# Patient Record
Sex: Male | Born: 1960 | Race: White | Hispanic: No | Marital: Married
Health system: Southern US, Community
[De-identification: ages and names within clinical notes are randomized; demographics above are authoritative.]

## PROBLEM LIST (undated history)

## (undated) DIAGNOSIS — L570 Actinic keratosis: Secondary | ICD-10-CM

## (undated) DIAGNOSIS — C4491 Basal cell carcinoma of skin, unspecified: Secondary | ICD-10-CM

## (undated) HISTORY — DX: Basal cell carcinoma of skin, unspecified: C44.91

## (undated) HISTORY — DX: Actinic keratosis: L57.0

---

## 2000-08-13 ENCOUNTER — Encounter: Payer: Self-pay | Admitting: Family Medicine

## 2000-08-13 ENCOUNTER — Encounter: Admission: RE | Admit: 2000-08-13 | Discharge: 2000-08-13 | Payer: Self-pay | Admitting: Family Medicine

## 2004-03-03 ENCOUNTER — Emergency Department: Payer: Self-pay | Admitting: Emergency Medicine

## 2008-01-10 ENCOUNTER — Ambulatory Visit: Payer: Self-pay | Admitting: Family Medicine

## 2009-05-04 ENCOUNTER — Ambulatory Visit: Payer: Self-pay | Admitting: Family Medicine

## 2009-05-15 ENCOUNTER — Ambulatory Visit: Payer: Self-pay | Admitting: Internal Medicine

## 2012-02-07 ENCOUNTER — Ambulatory Visit: Payer: Self-pay | Admitting: Internal Medicine

## 2012-02-07 LAB — URINALYSIS, COMPLETE
Bacteria: NEGATIVE
Bilirubin,UR: NEGATIVE
Blood: NEGATIVE
Glucose,UR: NEGATIVE mg/dL (ref 0–75)
Ketone: NEGATIVE
Leukocyte Esterase: NEGATIVE
Nitrite: NEGATIVE
Ph: 6 (ref 4.5–8.0)
Protein: NEGATIVE
RBC,UR: NONE SEEN /HPF (ref 0–5)
Specific Gravity: 1.02 (ref 1.003–1.030)
Squamous Epithelial: NONE SEEN

## 2012-02-07 LAB — RAPID INFLUENZA A&B ANTIGENS

## 2012-02-08 ENCOUNTER — Emergency Department: Payer: Self-pay | Admitting: Internal Medicine

## 2012-02-08 LAB — CBC
HCT: 46.6 % (ref 40.0–52.0)
HGB: 16.1 g/dL (ref 13.0–18.0)
MCH: 31.1 pg (ref 26.0–34.0)
MCHC: 34.5 g/dL (ref 32.0–36.0)
MCV: 90 fL (ref 80–100)
Platelet: 235 10*3/uL (ref 150–440)
RBC: 5.17 10*6/uL (ref 4.40–5.90)
RDW: 12.6 % (ref 11.5–14.5)
WBC: 9.9 10*3/uL (ref 3.8–10.6)

## 2012-02-08 LAB — DIFFERENTIAL
Basophil #: 0.1 10*3/uL (ref 0.0–0.1)
Basophil %: 0.7 %
Eosinophil #: 0 10*3/uL (ref 0.0–0.7)
Eosinophil %: 0.3 %
Lymphocyte #: 1.3 10*3/uL (ref 1.0–3.6)
Lymphocyte %: 13.6 %
Monocyte #: 0.6 x10 3/mm (ref 0.2–1.0)
Monocyte %: 6.4 %
Neutrophil #: 7.9 10*3/uL — ABNORMAL HIGH (ref 1.4–6.5)
Neutrophil %: 79 %

## 2012-02-08 LAB — COMPREHENSIVE METABOLIC PANEL
Albumin: 4.1 g/dL (ref 3.4–5.0)
Alkaline Phosphatase: 91 U/L (ref 50–136)
Anion Gap: 9 (ref 7–16)
BUN: 12 mg/dL (ref 7–18)
Bilirubin,Total: 1.1 mg/dL — ABNORMAL HIGH (ref 0.2–1.0)
Calcium, Total: 8.6 mg/dL (ref 8.5–10.1)
Chloride: 103 mmol/L (ref 98–107)
Co2: 27 mmol/L (ref 21–32)
Creatinine: 1.44 mg/dL — ABNORMAL HIGH (ref 0.60–1.30)
EGFR (African American): >60
EGFR (Non-African Amer.): 56 — ABNORMAL LOW
Glucose: 117 mg/dL — ABNORMAL HIGH (ref 65–99)
Osmolality: 278 (ref 275–301)
Potassium: 3.9 mmol/L (ref 3.5–5.1)
SGOT(AST): 27 U/L (ref 15–37)
SGPT (ALT): 22 U/L (ref 12–78)
Sodium: 139 mmol/L (ref 136–145)
Total Protein: 7.4 g/dL (ref 6.4–8.2)

## 2012-02-09 LAB — URINE CULTURE

## 2012-02-14 LAB — CULTURE, BLOOD (SINGLE)

## 2014-03-26 IMAGING — CR DG CHEST 2V
1 series · 2 of 2 positions shown · non-contrast
Comparison: none

REASON FOR EXAM: fever, cough
COMMENTS:

PROCEDURE:     DXR - DXR CHEST PA (OR AP) AND LATERAL  - February 08, 2012  [DATE]
RESULT:     Comparison: None.

[Series 1: w chest pa · 0.14mm/px · 2 of 2 slices shown]
[im 1/2]
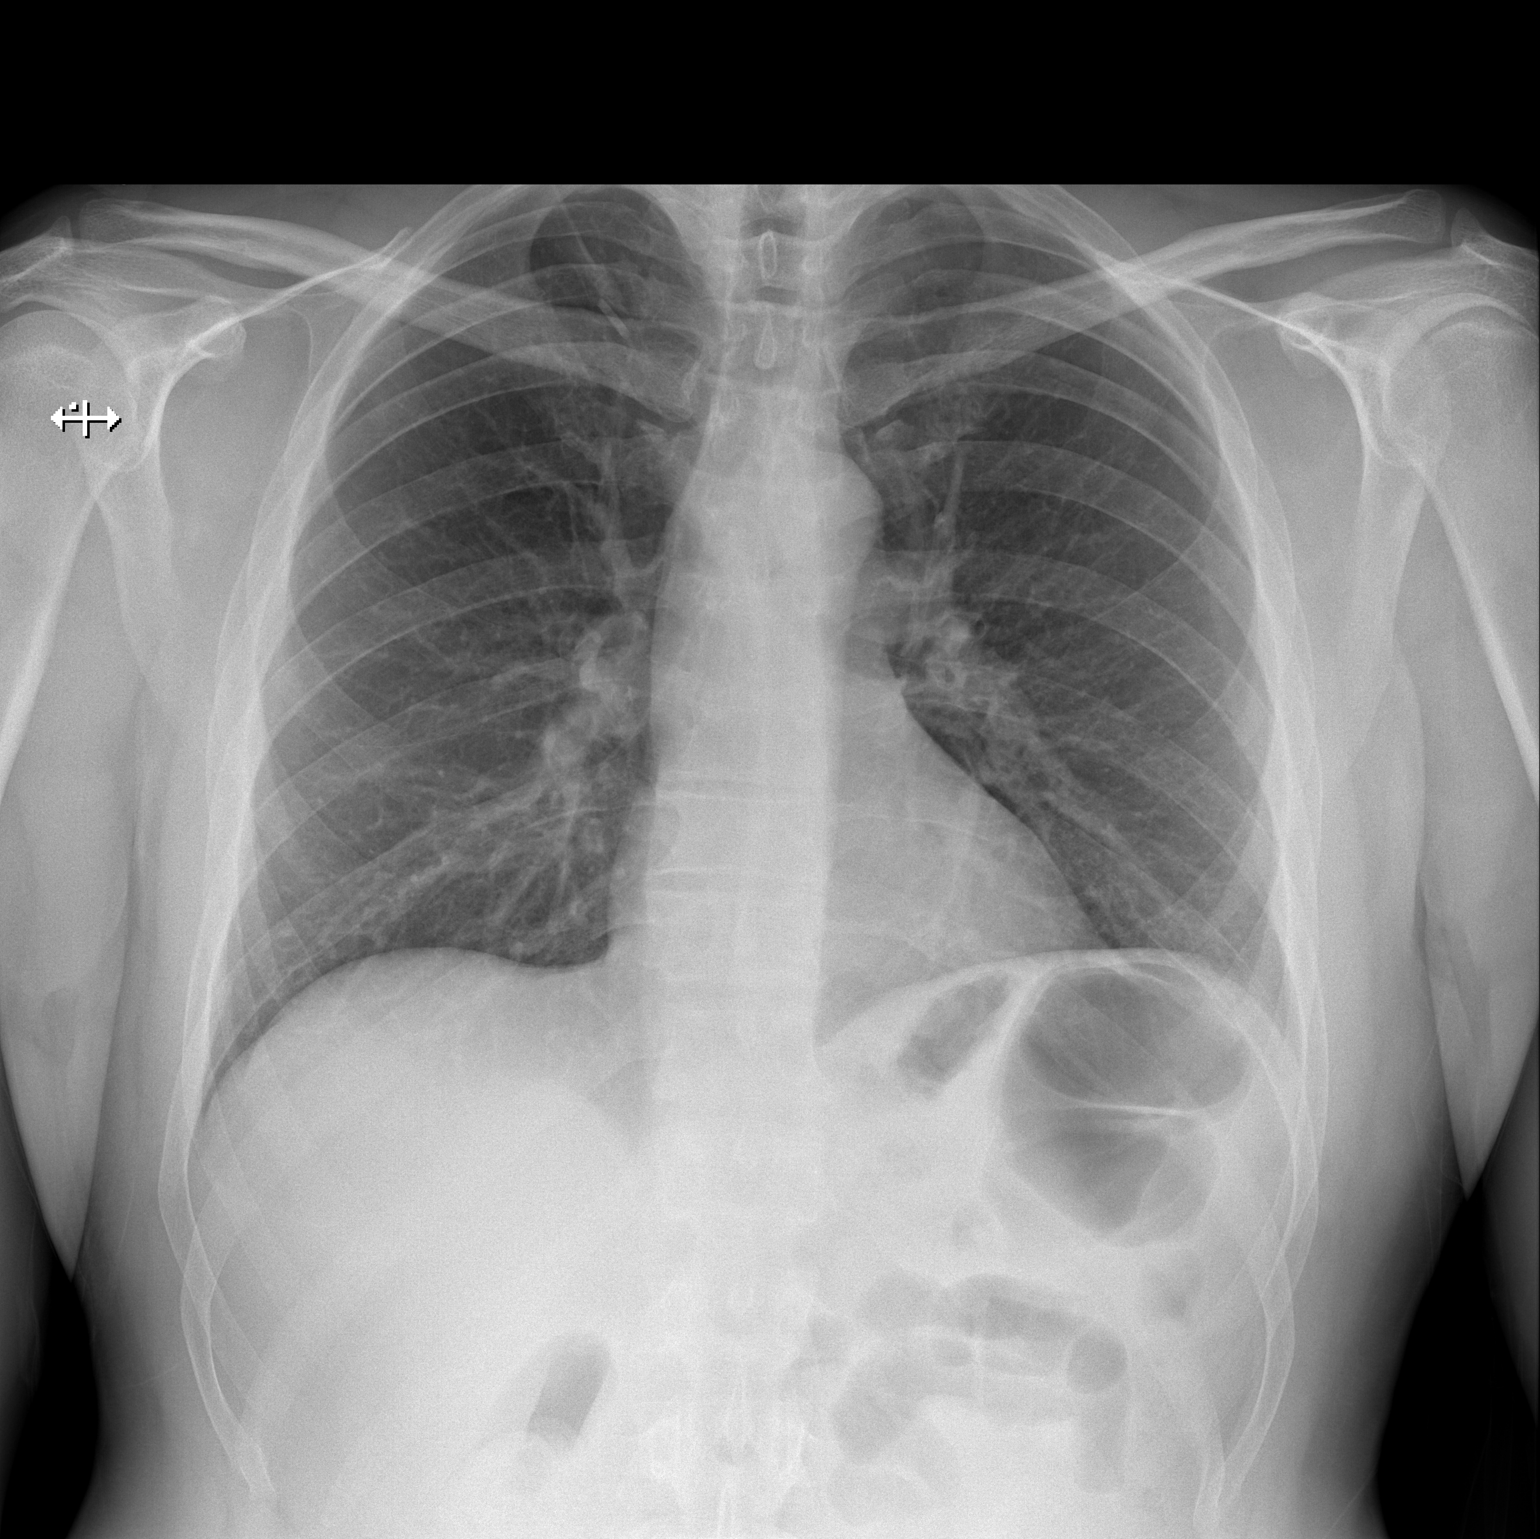
[im 2/2]
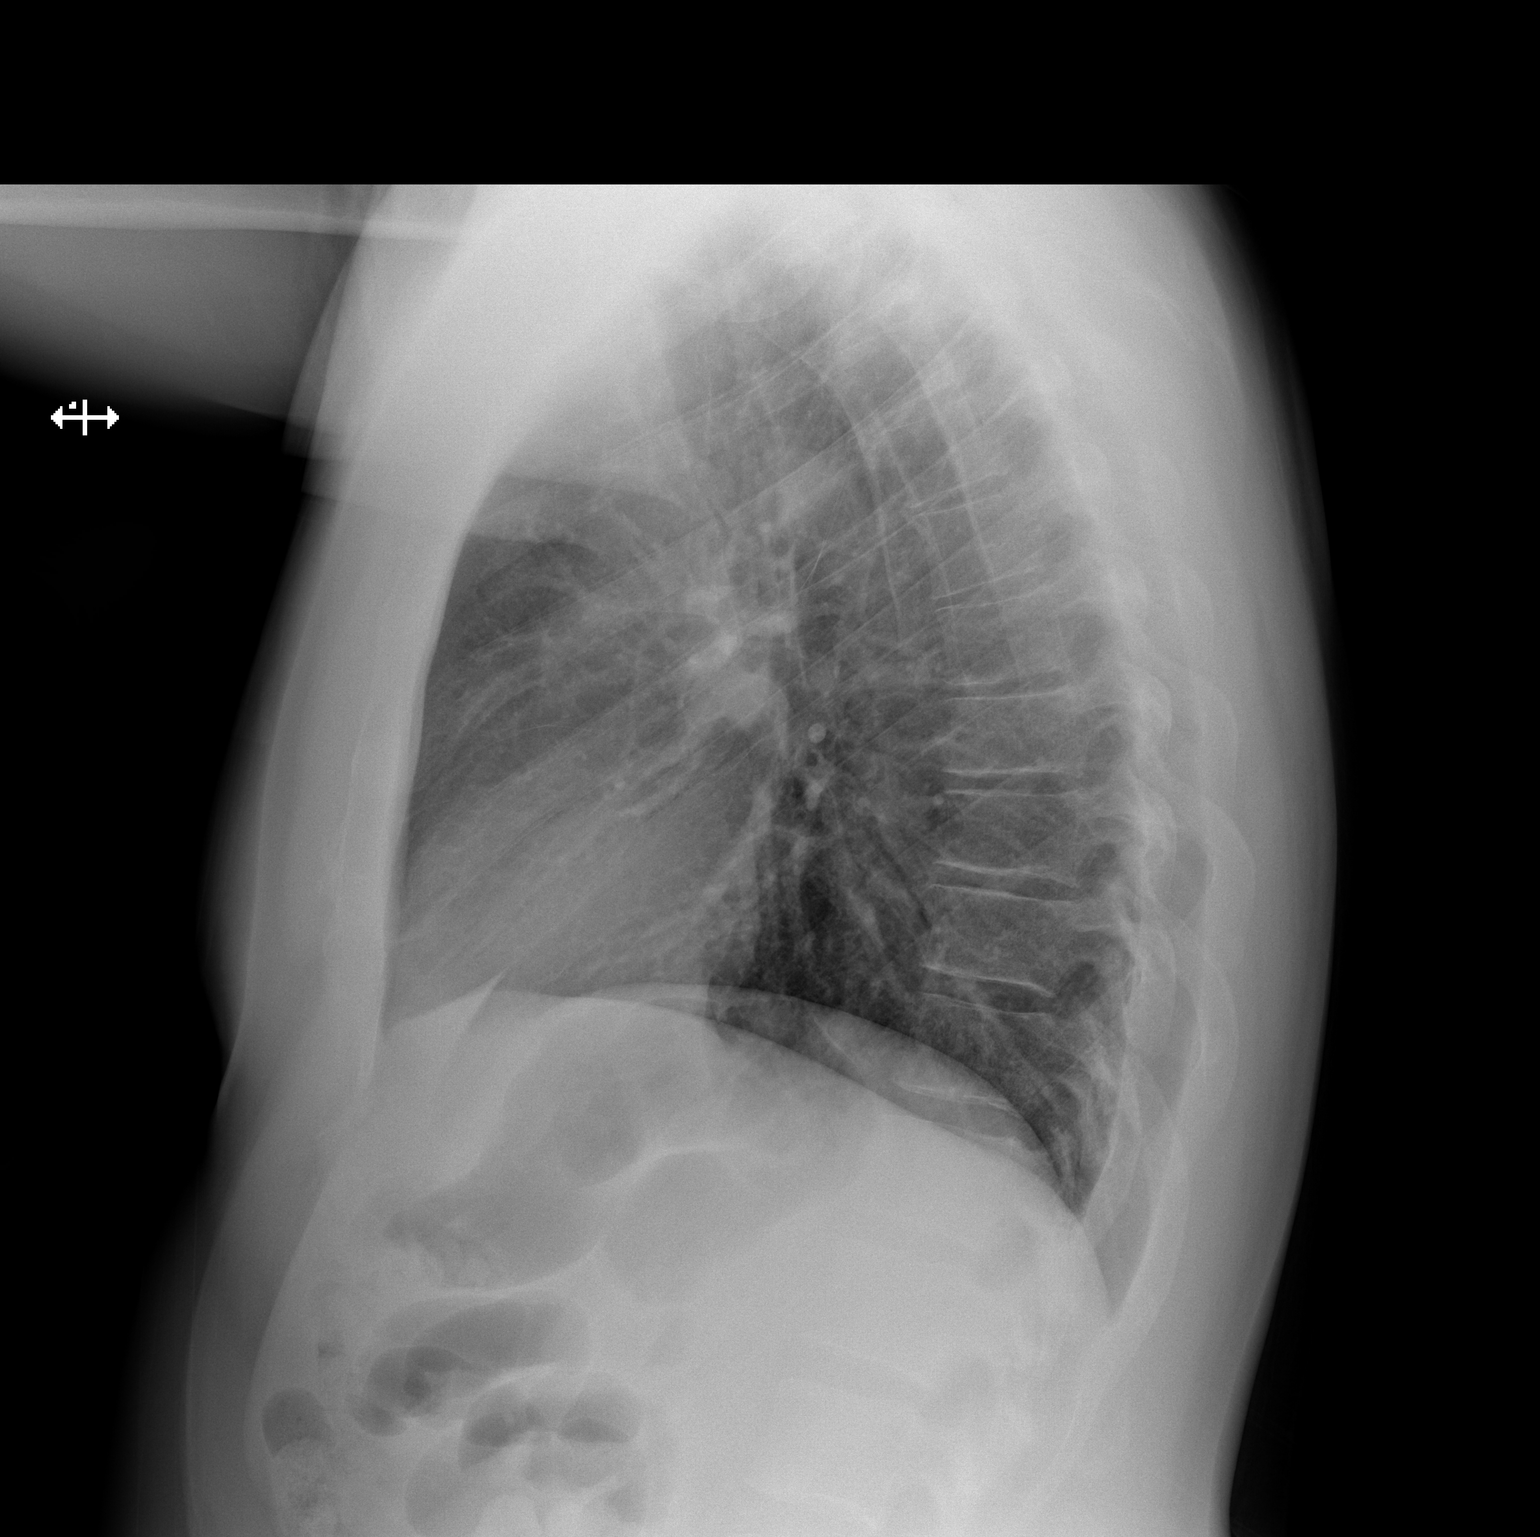

[2 of 2 positions shown; findings below may reference images not displayed]

FINDINGS: The heart and mediastinum are within normal limits. No focal pulmonary
opacities. Small linear density overlying the right upper lung is to be
external to the patient.
IMPRESSION: No acute cardiopulmonary disease.

[REDACTED]

## 2020-02-20 ENCOUNTER — Other Ambulatory Visit: Payer: Self-pay

## 2020-02-20 ENCOUNTER — Encounter: Payer: Self-pay | Admitting: Dermatology

## 2020-02-20 ENCOUNTER — Ambulatory Visit: Payer: Self-pay | Admitting: Dermatology

## 2020-02-20 DIAGNOSIS — B360 Pityriasis versicolor: Secondary | ICD-10-CM | POA: Diagnosis not present

## 2020-02-20 DIAGNOSIS — D1800 Hemangioma unspecified site: Secondary | ICD-10-CM

## 2020-02-20 DIAGNOSIS — Z85828 Personal history of other malignant neoplasm of skin: Secondary | ICD-10-CM

## 2020-02-20 DIAGNOSIS — L821 Other seborrheic keratosis: Secondary | ICD-10-CM

## 2020-02-20 DIAGNOSIS — D229 Melanocytic nevi, unspecified: Secondary | ICD-10-CM

## 2020-02-20 DIAGNOSIS — B353 Tinea pedis: Secondary | ICD-10-CM | POA: Diagnosis not present

## 2020-02-20 DIAGNOSIS — L814 Other melanin hyperpigmentation: Secondary | ICD-10-CM

## 2020-02-20 DIAGNOSIS — I781 Nevus, non-neoplastic: Secondary | ICD-10-CM | POA: Diagnosis not present

## 2020-02-20 DIAGNOSIS — Z1283 Encounter for screening for malignant neoplasm of skin: Secondary | ICD-10-CM

## 2020-02-20 DIAGNOSIS — L578 Other skin changes due to chronic exposure to nonionizing radiation: Secondary | ICD-10-CM

## 2020-02-20 MED ORDER — KETOCONAZOLE 2 % EX CREA: 1.0000 "application " | TOPICAL_CREAM | Freq: Every day | CUTANEOUS | 1 refills | Status: AC

## 2020-02-20 MED ORDER — KETOCONAZOLE 2 % EX SHAM: MEDICATED_SHAMPOO | CUTANEOUS | 11 refills | Status: DC

## 2020-02-20 MED ORDER — TERBINAFINE HCL 250 MG PO TABS: 250.0000 mg | ORAL_TABLET | Freq: Every day | ORAL | 0 refills | Status: AC

## 2020-02-20 NOTE — Patient Instructions (Signed)
Recommend daily broad spectrum sunscreen SPF 30+ to sun-exposed areas, reapply every 2 hours as needed. Call for new or changing lesions.  

## 2020-02-20 NOTE — Progress Notes (Signed)
   Follow-Up Visit   Subjective  Austin [name] is a 59 y.o. male who presents for the following: TBSE. The patient presents for Total-Body Skin Exam (TBSE) for skin cancer screening and mole check. Patient here for full body skin exam and skin cancer screening. Patient has an area of concern on his right foot. Patient states he has hx of skin cancer.  The following portions of the chart were reviewed this encounter and updated as appropriate:  Allergies  Meds  Problems  Med Hx  Surg Hx  Fam Hx     Review of Systems:  No other skin or systemic complaints except as noted in HPI or Assessment and Plan.  Objective  Well appearing patient in no apparent distress; mood and affect are within normal limits.  A full examination was performed including scalp, head, eyes, ears, nose, lips, neck, chest, axillae, abdomen, back, buttocks, bilateral upper extremities, bilateral lower extremities, hands, feet, fingers, toes, fingernails, and toenails. All findings within normal limits unless otherwise noted below.  Objective  Right Instep: Scaling and maceration web spaces and over distal and lateral soles.    Assessment & Plan  Telangiectasia B/L cheek Recommend laser treatment  Tinea versicolor Left Shoulder - Anterior Start Ketoconazole shampoo appy to affected area 3 times week till clear then monthly ketoconazole (NIZORAL) 2 % shampoo - Left Shoulder - Anterior  Tinea pedis of right foot Right Instep Start ketoconazole cream apply to affected area on foot every night Start Terbinafine 250 mg Daily for 30 days. ketoconazole (NIZORAL) 2 % cream - Right Instep  terbinafine (LAMISIL) 250 MG tablet - Right Instep   Lentigines - Scattered tan macules - Discussed due to sun exposure - Benign, observe - Call for any changes  Seborrheic Keratoses - Stuck-on, waxy, tan-brown papules and plaques  - Discussed benign etiology and prognosis. - Observe - Call for any  changes  Melanocytic Nevi - Tan-brown and/or pink-flesh-colored symmetric macules and papules - Benign appearing on exam today - Observation - Call clinic for new or changing moles - Recommend daily use of broad spectrum spf 30+ sunscreen to sun-exposed areas.   Hemangiomas - Red papules - Discussed benign nature - Observe - Call for any changes  Actinic Damage - diffuse scaly erythematous macules with underlying dyspigmentation - Recommend daily broad spectrum sunscreen SPF 30+ to sun-exposed areas, reapply every 2 hours as needed.  - Call for new or changing lesions.  Skin cancer screening performed today.  History of Basal Cell Carcinoma of the Skin - No evidence of recurrence today - Recommend regular full body skin exams - Recommend daily broad spectrum sunscreen SPF 30+ to sun-exposed areas, reapply every 2 hours as needed.  - Call if any new or changing lesions are noted between office visits  Return in about 1 year (around 02/19/2021) for TBSE.  I, Donzetta Kohut, CMA, am acting as scribe for Sarina Ser, MD . Documentation: I have reviewed the above documentation for accuracy and completeness, and I agree with the above.  Sarina Ser, MD

## 2021-02-20 ENCOUNTER — Encounter: Payer: BC Managed Care – PPO | Admitting: Dermatology

## 2021-03-06 ENCOUNTER — Other Ambulatory Visit: Payer: Self-pay

## 2021-03-06 ENCOUNTER — Encounter: Payer: Self-pay | Admitting: Dermatology

## 2021-03-06 ENCOUNTER — Ambulatory Visit: Payer: BC Managed Care – PPO | Admitting: Dermatology

## 2021-03-06 DIAGNOSIS — L573 Poikiloderma of Civatte: Secondary | ICD-10-CM

## 2021-03-06 DIAGNOSIS — L814 Other melanin hyperpigmentation: Secondary | ICD-10-CM

## 2021-03-06 DIAGNOSIS — D1800 Hemangioma unspecified site: Secondary | ICD-10-CM

## 2021-03-06 DIAGNOSIS — L209 Atopic dermatitis, unspecified: Secondary | ICD-10-CM | POA: Diagnosis not present

## 2021-03-06 DIAGNOSIS — L578 Other skin changes due to chronic exposure to nonionizing radiation: Secondary | ICD-10-CM | POA: Diagnosis not present

## 2021-03-06 DIAGNOSIS — Z85828 Personal history of other malignant neoplasm of skin: Secondary | ICD-10-CM

## 2021-03-06 DIAGNOSIS — Z1283 Encounter for screening for malignant neoplasm of skin: Secondary | ICD-10-CM

## 2021-03-06 DIAGNOSIS — L821 Other seborrheic keratosis: Secondary | ICD-10-CM

## 2021-03-06 DIAGNOSIS — D229 Melanocytic nevi, unspecified: Secondary | ICD-10-CM

## 2021-03-06 MED ORDER — MOMETASONE FUROATE 0.1 % EX CREA: TOPICAL_CREAM | CUTANEOUS | 1 refills | Status: DC

## 2021-03-06 NOTE — Patient Instructions (Signed)

## 2021-03-06 NOTE — Progress Notes (Signed)
Follow-Up Visit   Subjective  Austin [name] is a 60 y.o. male who presents for the following: Annual Exam (Hx BCC - patient has a rash on his L hand that has been there for about 3-4 weeks. He tried OTC Calihist and a jock itch medication OTC. The jock itch medication has seemed to help some. ). The patient presents for Total-Body Skin Exam (TBSE) for skin cancer screening and mole check.  The following portions of the chart were reviewed this encounter and updated as appropriate:   Allergies  Meds  Problems  Med Hx  Surg Hx  Fam Hx     Review of Systems:  No other skin or systemic complaints except as noted in HPI or Assessment and Plan.  Objective  Well appearing patient in no apparent distress; mood and affect are within normal limits.  A full examination was performed including scalp, head, eyes, ears, nose, lips, neck, chest, axillae, abdomen, back, buttocks, bilateral upper extremities, bilateral lower extremities, hands, feet, fingers, toes, fingernails, and toenails. All findings within normal limits unless otherwise noted below.  L hand dorsum 2.0 x 1.5 cm pink scaly patch. Annular scaly patches x 2.      Assessment & Plan  Atopic dermatitis L hand dorsum Start Mometasone 0.1% cream to aa BID PRN. Topical steroids (such as triamcinolone, fluocinolone, fluocinonide, mometasone, clobetasol, halobetasol, betamethasone, hydrocortisone) can cause thinning and lightening of the skin if they are used for too long in the same area. Your physician has selected the right strength medicine for your problem and area affected on the body. Please use your medication only as directed by your physician to prevent side effects.   mometasone (ELOCON) 0.1 % cream - L hand dorsum Apply to rash on the hand BID PRN. Avoid face, groin, and axilla.  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 Advised cosmetic procedure  and not covered by insurance.   Skin cancer screening  Lentigines - Scattered tan macules - Due to sun exposure - Benign-appearing, observe - Recommend daily broad spectrum sunscreen SPF 30+ to sun-exposed areas, reapply every 2 hours as needed. - Call for any changes  Seborrheic Keratoses - Stuck-on, waxy, tan-brown papules and/or plaques  - Benign-appearing - Discussed benign etiology and prognosis. - Observe - Call for any changes  Melanocytic Nevi - Tan-brown and/or pink-flesh-colored symmetric macules and papules - Benign appearing on exam today - Observation - Call clinic for new or changing moles - Recommend daily use of broad spectrum spf 30+ sunscreen to sun-exposed areas.   Hemangiomas - Red papules - Discussed benign nature - Observe - Call for any changes  Actinic Damage - Chronic condition, secondary to cumulative UV/sun exposure - diffuse scaly erythematous macules with underlying dyspigmentation - Recommend daily broad spectrum sunscreen SPF 30+ to sun-exposed areas, reapply every 2 hours as needed.  - Staying in the shade or wearing long sleeves, sun glasses (UVA+UVB protection) and wide brim hats (4-inch brim around the entire circumference of the hat) are also recommended for sun protection.  - Call for new or changing lesions.  History of Basal Cell Carcinoma of the Skin - of the L scalp  - No evidence of recurrence today - Recommend regular full body skin exams - Recommend daily broad spectrum sunscreen SPF 30+ to sun-exposed areas, reapply every 2 hours as needed.  - Call if any new or changing lesions are noted between office visits  Skin  cancer screening performed today.  Return in about 1 year (around 03/06/2022) for TBSE - Hx BCC.  Luther Redo, CMA, am acting as scribe for Sarina Ser, MD . Documentation: I have reviewed the above documentation for accuracy and completeness, and I agree with the above.  Sarina Ser, MD

## 2022-03-12 ENCOUNTER — Ambulatory Visit: Payer: BC Managed Care – PPO | Admitting: Dermatology

## 2022-05-22 ENCOUNTER — Other Ambulatory Visit: Payer: Self-pay | Admitting: Dermatology

## 2022-05-22 DIAGNOSIS — L209 Atopic dermatitis, unspecified: Secondary | ICD-10-CM

## 2022-06-02 ENCOUNTER — Ambulatory Visit: Payer: BC Managed Care – PPO | Admitting: Dermatology

## 2022-07-02 ENCOUNTER — Ambulatory Visit: Payer: BC Managed Care – PPO | Admitting: Dermatology

## 2022-07-02 VITALS — BP 127/84

## 2022-07-02 DIAGNOSIS — Z1283 Encounter for screening for malignant neoplasm of skin: Secondary | ICD-10-CM | POA: Diagnosis not present

## 2022-07-02 DIAGNOSIS — Z85828 Personal history of other malignant neoplasm of skin: Secondary | ICD-10-CM

## 2022-07-02 DIAGNOSIS — L209 Atopic dermatitis, unspecified: Secondary | ICD-10-CM

## 2022-07-02 DIAGNOSIS — L738 Other specified follicular disorders: Secondary | ICD-10-CM

## 2022-07-02 DIAGNOSIS — D229 Melanocytic nevi, unspecified: Secondary | ICD-10-CM

## 2022-07-02 DIAGNOSIS — L578 Other skin changes due to chronic exposure to nonionizing radiation: Secondary | ICD-10-CM | POA: Diagnosis not present

## 2022-07-02 DIAGNOSIS — Z79899 Other long term (current) drug therapy: Secondary | ICD-10-CM

## 2022-07-02 DIAGNOSIS — L2089 Other atopic dermatitis: Secondary | ICD-10-CM | POA: Diagnosis not present

## 2022-07-02 DIAGNOSIS — L814 Other melanin hyperpigmentation: Secondary | ICD-10-CM

## 2022-07-02 DIAGNOSIS — L821 Other seborrheic keratosis: Secondary | ICD-10-CM

## 2022-07-02 MED ORDER — MOMETASONE FUROATE 0.1 % EX CREA: TOPICAL_CREAM | CUTANEOUS | 1 refills | Status: AC

## 2022-07-02 NOTE — Progress Notes (Signed)
Follow-Up Visit   Subjective  Austin [name] is a 62 y.o. male who presents for the following: Annual Exam (History of BCC - The patient presents for Total-Body Skin Exam (TBSE) for skin cancer screening and mole check.  The patient has spots, moles and lesions to be evaluated, some may be new or changing and the patient has concerns that these could be cancer./).  The following portions of the chart were reviewed this encounter and updated as appropriate:   Allergies  Meds  Problems  Med Hx  Surg Hx  Fam Hx     Review of Systems:  No other skin or systemic complaints except as noted in HPI or Assessment and Plan.  Objective  Well appearing patient in no apparent distress; mood and affect are within normal limits.  A full examination was performed including scalp, head, eyes, ears, nose, lips, neck, chest, axillae, abdomen, back, buttocks, bilateral upper extremities, bilateral lower extremities, hands, feet, fingers, toes, fingernails, and toenails. All findings within normal limits unless otherwise noted below.  Face Yellow papules  Left Hand - Posterior Scaly patch   Assessment & Plan   History of Basal Cell Carcinoma of the Skin - No evidence of recurrence today - Recommend regular full body skin exams - Recommend daily broad spectrum sunscreen SPF 30+ to sun-exposed areas, reapply every 2 hours as needed.  - Call if any new or changing lesions are noted between office visits  Lentigines - Scattered tan macules - Due to sun exposure - Benign-appearing, observe - Recommend daily broad spectrum sunscreen SPF 30+ to sun-exposed areas, reapply every 2 hours as needed. - Call for any changes  Seborrheic Keratoses - Stuck-on, waxy, tan-brown papules and/or plaques  - Benign-appearing - Discussed benign etiology and prognosis. - Observe - Call for any changes  Melanocytic Nevi - Tan-brown and/or pink-flesh-colored symmetric macules and papules - Benign appearing  on exam today - Observation - Call clinic for new or changing moles - Recommend daily use of broad spectrum spf 30+ sunscreen to sun-exposed areas.   Hemangiomas - Red papules - Discussed benign nature - Observe - Call for any changes  Actinic Damage - Chronic condition, secondary to cumulative UV/sun exposure - diffuse scaly erythematous macules with underlying dyspigmentation - Recommend daily broad spectrum sunscreen SPF 30+ to sun-exposed areas, reapply every 2 hours as needed.  - Staying in the shade or wearing long sleeves, sun glasses (UVA+UVB protection) and wide brim hats (4-inch brim around the entire circumference of the hat) are also recommended for sun protection.  - Call for new or changing lesions.  Skin cancer screening performed today.  Sebaceous hyperplasia Face Benign-appearing.  Observation.  Call clinic for new or changing lesions.  Recommend daily use of broad spectrum spf 30+ sunscreen to sun-exposed areas.   Other atopic dermatitis Left Hand - Posterior Atopic dermatitis (eczema) is a chronic, relapsing, pruritic condition that can significantly affect quality of life. It is often associated with allergic rhinitis and/or asthma and can require treatment with topical medications, phototherapy, or in severe cases biologic injectable medication (Dupixent; Adbry) or Oral JAK inhibitors.  Continue Mometasone cream bid prn up to 5 days a week only on rash when present.  Atopic dermatitis, unspecified type Related Medications mometasone (ELOCON) 0.1 % cream Apply to rash on the hand BID PRN. Avoid face, groin, and axilla.  Return in about 1 year (around 07/03/2023) for TBSE.  I, Ashok Cordia, CMA, am acting as scribe for Sarina Ser,  MD . Documentation: I have reviewed the above documentation for accuracy and completeness, and I agree with the above.  Sarina Ser, MD

## 2022-07-02 NOTE — Patient Instructions (Signed)
Due to recent changes in healthcare laws, you may see results of your pathology and/or laboratory studies on MyChart before the doctors have had a chance to review them. We understand that in some cases there may be results that are confusing or concerning to you. Please understand that not all results are received at the same time and often the doctors may need to interpret multiple results in order to provide you with the best plan of care or course of treatment. Therefore, we ask that you please give us 2 business days to thoroughly review all your results before contacting the office for clarification. Should we see a critical lab result, you will be contacted sooner.   If You Need Anything After Your Visit  If you have any questions or concerns for your doctor, please call our main line at [phone] and press option 4 to reach your doctor's medical assistant. If no one answers, please leave a voicemail as directed and we will return your call as soon as possible. Messages left after 4 pm will be answered the following business day.   You may also send us a message via MyChart. We typically respond to MyChart messages within 1-2 business days.  For prescription refills, please ask your pharmacy to contact our office. Our fax number is [phone].  If you have an urgent issue when the clinic is closed that cannot wait until the next business day, you can page your doctor at the number below.    Please note that while we do our best to be available for urgent issues outside of office hours, we are not available 24/7.   If you have an urgent issue and are unable to reach us, you may choose to seek medical care at your doctor's office, retail clinic, urgent care center, or emergency room.  If you have a medical emergency, please immediately call 911 or go to the emergency department.  Pager Numbers  - Dr. Kowalski: [phone]  - Dr. Moye: [phone]  - Dr. Stewart:  [phone]  In the event of inclement weather, please call our main line at [phone] for an update on the status of any delays or closures.  Dermatology Medication Tips: Please keep the boxes that topical medications come in in order to help keep track of the instructions about where and how to use these. Pharmacies typically print the medication instructions only on the boxes and not directly on the medication tubes.   If your medication is too expensive, please contact our office at [phone] option 4 or send us a message through MyChart.   We are unable to tell what your co-pay for medications will be in advance as this is different depending on your insurance coverage. However, we may be able to find a substitute medication at lower cost or fill out paperwork to get insurance to cover a needed medication.   If a prior authorization is required to get your medication covered by your insurance company, please allow us 1-2 business days to complete this process.  Drug prices often vary depending on where the prescription is filled and some pharmacies may offer cheaper prices.  The website www.goodrx.com contains coupons for medications through different pharmacies. The prices here do not account for what the cost may be with help from insurance (it may be cheaper with your insurance), but the website can give you the price if you did not use any insurance.  - You can print the associated coupon and take it with   your prescription to the pharmacy.  - You may also stop by our office during regular business hours and pick up a GoodRx coupon card.  - If you need your prescription sent electronically to a different pharmacy, notify our office through [facility] MyChart or by phone at [phone] option 4.     Si Usted Necesita Algo Despus de Su Visita  Tambin puede enviarnos un mensaje a travs de MyChart. Por lo general respondemos a los mensajes de MyChart en el transcurso de 1 a 2  das hbiles.  Para renovar recetas, por favor pida a su farmacia que se ponga en contacto con nuestra oficina. Nuestro nmero de fax es el [phone].  Si tiene un asunto urgente cuando la clnica est cerrada y que no puede esperar hasta el siguiente da hbil, puede llamar/localizar a su doctor(a) al nmero que aparece a continuacin.   Por favor, tenga en cuenta que aunque hacemos todo lo posible para estar disponibles para asuntos urgentes fuera del horario de oficina, no estamos disponibles las 24 horas del da, los 7 das de la semana.   Si tiene un problema urgente y no puede comunicarse con nosotros, puede optar por buscar atencin mdica  en el consultorio de su doctor(a), en una clnica privada, en un centro de atencin urgente o en una sala de emergencias.  Si tiene una emergencia mdica, por favor llame inmediatamente al 911 o vaya a la sala de emergencias.  Nmeros de bper  - Dr. Kowalski: [phone]  - Dra. Moye: [phone]  - Dra. Stewart: [phone]  En caso de inclemencias del tiempo, por favor llame a nuestra lnea principal al [phone] para una actualizacin sobre el estado de cualquier retraso o cierre.  Consejos para la medicacin en dermatologa: Por favor, guarde las cajas en las que vienen los medicamentos de uso tpico para ayudarle a seguir las instrucciones sobre dnde y cmo usarlos. Las farmacias generalmente imprimen las instrucciones del medicamento slo en las cajas y no directamente en los tubos del medicamento.   Si su medicamento es muy caro, por favor, pngase en contacto con nuestra oficina llamando al [phone] y presione la opcin 4 o envenos un mensaje a travs de MyChart.   No podemos decirle cul ser su copago por los medicamentos por adelantado ya que esto es diferente dependiendo de la cobertura de su seguro. Sin embargo, es posible que podamos encontrar un medicamento sustituto a menor costo o llenar un formulario para que el  seguro cubra el medicamento que se considera necesario.   Si se requiere una autorizacin previa para que su compaa de seguros cubra su medicamento, por favor permtanos de 1 a 2 das hbiles para completar este proceso.  Los precios de los medicamentos varan con frecuencia dependiendo del lugar de dnde se surte la receta y alguna farmacias pueden ofrecer precios ms baratos.  El sitio web www.goodrx.com tiene cupones para medicamentos de diferentes farmacias. Los precios aqu no tienen en cuenta lo que podra costar con la ayuda del seguro (puede ser ms barato con su seguro), pero el sitio web puede darle el precio si no utiliz ningn seguro.  - Puede imprimir el cupn correspondiente y llevarlo con su receta a la farmacia.  - Tambin puede pasar por nuestra oficina durante el horario de atencin regular y recoger una tarjeta de cupones de GoodRx.  - Si necesita que su receta se enve electrnicamente a una farmacia diferente, informe a nuestra oficina a travs de MyChart de [facility]   o por telfono llamando al [phone] y presione la opcin 4.  

## 2022-07-09 ENCOUNTER — Encounter: Payer: Self-pay | Admitting: Dermatology

## 2022-07-23 ENCOUNTER — Ambulatory Visit: Payer: BC Managed Care – PPO | Admitting: Dermatology

## 2023-07-08 ENCOUNTER — Ambulatory Visit: Payer: BC Managed Care – PPO | Admitting: Dermatology

## 2023-07-28 ENCOUNTER — Ambulatory Visit: Payer: BC Managed Care – PPO | Admitting: Dermatology

## 2023-07-28 ENCOUNTER — Encounter: Payer: Self-pay | Admitting: Dermatology

## 2023-07-28 DIAGNOSIS — Z1283 Encounter for screening for malignant neoplasm of skin: Secondary | ICD-10-CM

## 2023-07-28 DIAGNOSIS — L814 Other melanin hyperpigmentation: Secondary | ICD-10-CM

## 2023-07-28 DIAGNOSIS — L578 Other skin changes due to chronic exposure to nonionizing radiation: Secondary | ICD-10-CM | POA: Diagnosis not present

## 2023-07-28 DIAGNOSIS — W908XXA Exposure to other nonionizing radiation, initial encounter: Secondary | ICD-10-CM

## 2023-07-28 DIAGNOSIS — D1801 Hemangioma of skin and subcutaneous tissue: Secondary | ICD-10-CM

## 2023-07-28 DIAGNOSIS — Z7189 Other specified counseling: Secondary | ICD-10-CM

## 2023-07-28 DIAGNOSIS — L570 Actinic keratosis: Secondary | ICD-10-CM | POA: Diagnosis not present

## 2023-07-28 DIAGNOSIS — Z79899 Other long term (current) drug therapy: Secondary | ICD-10-CM

## 2023-07-28 DIAGNOSIS — B360 Pityriasis versicolor: Secondary | ICD-10-CM

## 2023-07-28 DIAGNOSIS — D229 Melanocytic nevi, unspecified: Secondary | ICD-10-CM

## 2023-07-28 DIAGNOSIS — Z85828 Personal history of other malignant neoplasm of skin: Secondary | ICD-10-CM

## 2023-07-28 DIAGNOSIS — L821 Other seborrheic keratosis: Secondary | ICD-10-CM

## 2023-07-28 MED ORDER — KETOCONAZOLE 2 % EX SHAM: 1.0000 | MEDICATED_SHAMPOO | CUTANEOUS | 11 refills | Status: AC

## 2023-07-28 NOTE — Progress Notes (Signed)
 Follow-Up Visit   Subjective  Austin [name] is a 63 y.o. male who presents for the following: Skin Cancer Screening and Full Body Skin Exam hx of BCC, hx of Atopic Derm pt clear, no treatment at this time  The patient presents for Total-Body Skin Exam (TBSE) for skin cancer screening and mole check. The patient has spots, moles and lesions to be evaluated, some may be new or changing and the patient may have concern these could be cancer.  The following portions of the chart were reviewed this encounter and updated as appropriate: medications, allergies, medical history  Review of Systems:  No other skin or systemic complaints except as noted in HPI or Assessment and Plan.  Objective  Well appearing patient in no apparent distress; mood and affect are within normal limits.  A full examination was performed including scalp, head, eyes, ears, nose, lips, neck, chest, axillae, abdomen, back, buttocks, bilateral upper extremities, bilateral lower extremities, hands, feet, fingers, toes, fingernails, and toenails. All findings within normal limits unless otherwise noted below.   Relevant physical exam findings are noted in the Assessment and Plan.  scalp x 2 (2) Pink scaly macules  Assessment & Plan   SKIN CANCER SCREENING PERFORMED TODAY.  ACTINIC DAMAGE - Chronic condition, secondary to cumulative UV/sun exposure - diffuse scaly erythematous macules with underlying dyspigmentation - Recommend daily broad spectrum sunscreen SPF 30+ to sun-exposed areas, reapply every 2 hours as needed.  - Staying in the shade or wearing long sleeves, sun glasses (UVA+UVB protection) and wide brim hats (4-inch brim around the entire circumference of the hat) are also recommended for sun protection.  - Call for new or changing lesions.  LENTIGINES, SEBORRHEIC KERATOSES, HEMANGIOMAS - Benign normal skin lesions - Benign-appearing - Call for any changes  MELANOCYTIC NEVI - Tan-brown and/or  pink-flesh-colored symmetric macules and papules - Benign appearing on exam today - Observation - Call clinic for new or changing moles - Recommend daily use of broad spectrum spf 30+ sunscreen to sun-exposed areas.   HISTORY OF BASAL CELL CARCINOMA OF THE SKIN - No evidence of recurrence today - Recommend regular full body skin exams - Recommend daily broad spectrum sunscreen SPF 30+ to sun-exposed areas, reapply every 2 hours as needed.  - Call if any new or changing lesions are noted between office visits  - L scalp  Tinea Versicolor Neck, chest, upper back Exam: Multiple dyspigmented macules and patches with fine scale neck, chest, upper back Chronic and persistent condition with duration or expected duration over one year. Condition is bothersome/symptomatic for patient. Currently flared. Tinea versicolor is a chronic recurrent skin rash causing discolored scaly spots most commonly seen on back, chest, and/or shoulders.  It is generally asymptomatic. The rash is due to overgrowth of a common type of yeast present on everyone's skin and it is not contagious.  It tends to flare more in the summer due to increased sweating on trunk.  After rash is treated, the scaliness will resolve, but the discoloration will take longer to return to normal pigmentation. The periodic use of an OTC medicated soap/shampoo with zinc or selenium sulfide can be helpful to prevent yeast overgrowth and recurrence. Treatment:  Restart Ketoconazole 2% shampoo wash trunk, 3x/wk  for 6 weeks, then 1 monthly, let sit 5 minutes and rinse off Long term medication management.  Patient is using long term (months to years) prescription medication  to control their dermatologic condition.  These medications require periodic monitoring to evaluate  for efficacy and side effects and may require periodic laboratory monitoring.  AK (ACTINIC KERATOSIS) (2) scalp x 2 (2) Actinic keratoses are precancerous spots that appear  secondary to cumulative UV radiation exposure/sun exposure over time. They are chronic with expected duration over 1 year. A portion of actinic keratoses will progress to squamous cell carcinoma of the skin. It is not possible to reliably predict which spots will progress to skin cancer and so treatment is recommended to prevent development of skin cancer.  Recommend daily broad spectrum sunscreen SPF 30+ to sun-exposed areas, reapply every 2 hours as needed.  Recommend staying in the shade or wearing long sleeves, sun glasses (UVA+UVB protection) and wide brim hats (4-inch brim around the entire circumference of the hat). Call for new or changing lesions. Destruction of lesion - scalp x 2 (2) Complexity: simple   Destruction method: cryotherapy   Informed consent: discussed and consent obtained   Timeout:  patient name, date of birth, surgical site, and procedure verified Lesion destroyed using liquid nitrogen: Yes   Region frozen until ice ball extended beyond lesion: Yes   Outcome: patient tolerated procedure well with no complications   Post-procedure details: wound care instructions given   Return in about 1 year (around 07/27/2024) for TBSE, Hx of BCC, Hx of AKs.  I, Ardis Rowan, RMA, am acting as scribe for Armida Sans, MD .  Documentation: I have reviewed the above documentation for accuracy and completeness, and I agree with the above.  Armida Sans, MD

## 2023-07-28 NOTE — Patient Instructions (Addendum)

## 2023-12-18 ENCOUNTER — Other Ambulatory Visit: Payer: Self-pay | Admitting: Medical Genetics

## 2024-01-01 ENCOUNTER — Other Ambulatory Visit: Payer: Self-pay | Admitting: Medical Genetics

## 2024-01-01 DIAGNOSIS — Z006 Encounter for examination for normal comparison and control in clinical research program: Secondary | ICD-10-CM

## 2024-01-01 NOTE — Addendum Note (Signed)
 Addended by: REMUS NOEMI PARAS on: 01/01/2024 10:31 AM   Modules accepted: Orders

## 2024-01-18 LAB — GENECONNECT MOLECULAR SCREEN: Genetic Analysis Overall Interpretation: POSITIVE — AB

## 2024-01-20 ENCOUNTER — Telehealth: Payer: Self-pay | Admitting: Medical Genetics

## 2024-01-20 DIAGNOSIS — Z1589 Genetic susceptibility to other disease: Secondary | ICD-10-CM | POA: Insufficient documentation

## 2024-01-20 NOTE — Telephone Encounter (Signed)
 [place] [facility] Positive Result Note 01/20/2024 2:26 PM  FIRST ATTEMPT: Confirmed I was speaking with Austin [name] [id number] by using name and DOB. Informed participant the reason for this call is to provide results for the above study. Results revealed Lynch Syndrome. Genetic counseling was offered and participant requests a call back to schedule. All questions were answered, and participant was thanked for their time and support of the above study. Participant was encouraged to contact [facility] if they have any further questions or concerns.

## 2024-01-22 ENCOUNTER — Telehealth: Payer: Self-pay | Admitting: Medical Genetics

## 2024-01-22 NOTE — Telephone Encounter (Signed)
 [place] [facility] 01/20/2024 4:30pm  Confirmed I was speaking with Phat K Saksa [phone] by using name and DOB. Genetic counseling was offered and participant is scheduled. All questions were answered, and participant was thanked for their time and support of the above study. Participant was encouraged to contact [facility] if they have any further questions or concerns.    Jordyn Pennstrom, BS [place]  [facility] Clinical Research Specialist II Direct Dial: [phone]  Fax: [phone]

## 2024-01-28 ENCOUNTER — Ambulatory Visit: Admitting: Genetic Counselor

## 2024-01-28 DIAGNOSIS — Z1509 Genetic susceptibility to other malignant neoplasm: Secondary | ICD-10-CM

## 2024-02-04 ENCOUNTER — Encounter: Payer: Self-pay | Admitting: Genetic Counselor

## 2024-02-04 NOTE — Progress Notes (Signed)
 PRIMARY PROVIDER:  Dwan Ozell BIRCH, DO  PRIMARY REASON FOR VISIT:  1. Monoallelic mutation of PMS2 gene     [name], a 63 y.o. male, was seen for a Austin [name] genetic counseling as follow up to his participation in the [place] Study, part of the [facility] Research Program. [name] presents to clinic today to discuss the results of the genetic testing provided by the GeneConnect study, which did identify a pathogenic variant in the PMS2 gene.   RELEVANT MEDICAL HISTORY:  [name] is a 63 y.o. male with a personal history of basal cell carcinoma. He does not report any additional history of cancer. He had a colonoscopy in 2016, with no polyps detected.   [name] does not report a history of elevated cholesterol.   Past Medical History:  Diagnosis Date   Actinic keratosis    Basal cell carcinoma    L scalp    No past surgical history on file.  Social History   Socioeconomic History   Marital status: Married    Spouse name: Not on file   Number of children: Not on file   Years of education: Not on file   Highest education level: Not on file  Occupational History   Not on file  Tobacco Use   Smoking status: Not on file   Smokeless tobacco: Not on file  Substance and Sexual Activity   Alcohol use: Not on file   Drug use: Not on file   Sexual activity: Not on file  Other Topics Concern   Not on file  Social History Narrative   Not on file   Social Drivers of Health   Financial Resource Strain: Low Risk  (09/29/2023)   Received [facility]   Overall Financial Resource Strain (CARDIA)    Difficulty of Paying Living Expenses: Not hard at all  Food Insecurity: No Food Insecurity (09/29/2023)   Received from [facility]   Hunger Vital Sign    Within the past 12 months, you worried that your food would run out before you got the money to buy more.: Never true    Within the past 12 months, the food you bought just didn't last and you didn't  have money to get more.: Never true  Transportation Needs: No Transportation Needs (09/29/2023)   Received from [facility]   PRAPARE - Transportation    Lack of Transportation (Medical): No    Lack of Transportation (Non-Medical): No  Physical Activity: Not on file  Stress: Not on file  Social Connections: Unknown (10/26/2022)   [facility]   Social Network    Social Network: Not on file     FAMILY HISTORY:  [name] shared that he was adopted at birth. He connected with his birth mother when he was 52 years old. He unaware of previous family history of genetic testing or cancer, but information regarding health history of biologic relatives is limited. [name] does have three maternal half siblings. He does not have biologic children, but does have an adopted son.      GENETIC TEST RESULTS:  [name] participated in the [place] GeneConnect population genomic screening program. A single, heterozygous pathogenic variant was detected in the PMS2 gene called [id number] (p.Pro246_Pro247delinsCysValTer). There were no pathogenic or likely pathogenic variants detected in other genes reported on in this study.   [facility] Population Screen is a screening test that analyzes 11 genes related to hereditary  breast and ovarian cancer syndrome (HBOC), Lynch syndrome, and familial hypercholesterolemia. These include APOB, BRCA1, BRCA2, EPCAM, LDLR, LDLRAP1, PCSK9, PMS2 (excluding exons 11-15), MLH1, MSH2, MSH6. This test reports pathogenic and likely pathogenic variants, but does not report on variants of unknown significance. The absence of any additional pathogenic variants is reassuring, but does not rule out a hereditary condition. There are other variants and genes associated with heart disease, hereditary cancer and other inherited conditions that were not included in this test. Please see full test report in labs, labeled GeneConnect Molecular  Screen, dated 01/01/24. A section of the report is included below.     CLINICAL INFORMATION:  Pathogenic variants or mutations in PMS2 are associated with a condition called Lynch syndrome. Lynch syndrome is caused by a pathogenic variant in one of five genes (MLH1, MSH2, MSH6, PMS2, EPCAM) and increases risk for colorectal, endometrial, ovarian, urinary tract, and other cancers. The cancer risks and management recommendations associated with PMS2 mutations are listed below:  Type of Cancer PMS2 Mutation Lifetime Risk Average Lifetime Risk  Colorectal 8.7-20% 4.1%  Endometrial (uterus) 13-26% 3.1%   Other Lynch syndrome-associated cancers have been observed in individuals with PMS2 pathogenic variants, but it is unclear if individuals with PMS2 variants have an increased risk for these cancers compared to the general population.   Benign and malignant skin tumors such as sebaceous adenomas, sebaceous adenocarcinomas, and keratoacanthomas has been reported to be increased among patients with Lynch syndrome. Cumulative lifetime risk specific to PMS2 carriers is not available.  Risk numbers are based on NCCN v1.2025. Risk estimates may evolve over time as new information is learned about PMS2 mutations.    Management Recommendations: Per NCCN v1.2025  Colorectal Cancer Screening: High quality colonoscopy at age 77-35 (or 2-5 years prior to the earliest colon cancer if it is diagnosed before age 91) and repeat every 1-3 years Consider using daily aspirin to reduce the risk of colorectal cancer. The decision to use aspirin should be made on an individual basis, including discussion of individual risks, benefits, adverse effects, and childbearing [name] with a healthcare provider.   Endometrial Cancer Screening/Risk Reduction (for individuals assigned male at birth): Women should report any abnormal uterine bleeding or postmenopausal bleeding. The evaluation of these symptoms should include an  endometrial biopsy. A hysterectomy may be considered. The timing should be individualized based on whether childbearing is complete, comorbidities, and family history. Hysterectomy can be considered at age 4y and some women may consider removing their ovaries with surgery at the same time  Endometrial cancer screening does not have a proven benefit in women with Lynch Syndrome. However, endometrial biopsy is highly sensitive and specific as a diagnostic procedure. Screening via endometrial biopsy every 1-2 years starting at age 52-35 can be considered. Transvaginal ultrasounds may be considered in postmenopausal women at their clinician's discretion.  Additional Considerations: Patients of reproductive age should be made aware of options for prenatal diagnosis and assisted reproduction including pre-implantation genetic diagnosis. Individuals with a single pathogenic PMS2 variant are also carriers of constitutional mismatch repair deficiency (CMMRD) syndrome. CMMRD is a childhood-onset cancer predisposition syndrome that can present with hematological malignancies, cancers of the brain and central nervous system, Lynch syndrome-associated cancers (colon, uterine, small bowel, urinary tract), embryonic tumors, and sarcomas. Some affected individuals may also display cafe-au-lait macules. For there to be a risk of CMMRD in offspring, an individual and their partner would each have to have a single pathogenic variant in the same MMR gene; in  such a case, the risk of having an affected child is 25%.  This information is based on current understanding of the gene and may change in the future.    IMPLICATIONS FOR FAMILY MEMBERS: Individuals with PMS2 are encouraged to share information about their genetic test results with family members. A family letter will be provided to help share this result with relatives. "Cascade screening" is a systematic process through which additional family members with a PMS2  mutation are identified, starting with all first degree relatives over the age of 37 (parents, siblings, and children) of people who have a PMS2 mutation.   Hereditary predisposition to cancer due to pathogenic variants in the PMS2 gene has autosomal dominant inheritance. This means that an individual with a pathogenic variant has a 50% chance of passing the condition on to their children. Identification of a pathogenic variant allows for the recognition of at-risk relatives who can pursue testing for the familial variant.  Family members are encouraged to consider genetic testing for this familial pathogenic variant. As there are generally no childhood cancer risks associated with pathogenic variants in the PMS2 gene, individuals in the family are not typically recommended to have testing until they reach at least 62 years of age. They may contact our office at [phone] for more information or to schedule an appointment. Family members who live outside of the area are encouraged to find a genetic counselor in their area by visiting: [email or web address].  RESOURCES: Facing Our Risk of Cancer Empowered (FORCE): www.facingourrisk.org   Colon Cancer Alliance for Research and Education for Lynch Syndrome: SnitchSeek.be  [facility] Inherited Cancer Registry: https://inheritedcancer.net/  ADDITIONAL TESTING: [name] does not meet criteria for additional testing to be recommended at this time.   PLAN:  Referral to Gastroenterology is recommended to further discuss options for colon cancer screening and prevention. [name] to discuss this option with his primary care provider.   Results will be shared with Austin [name] primary care provider, Zimmerman, Michael D, DO for further management.   Genetic test results should be shared with relatives, who can consider undergoing genetic testing for the PMS2  pathogenic variant.   Lastly, we encouraged [name]  to remain in contact with [facility] genetics team so that we can continuously update the family history and inform him of any changes in our understanding of Lynch syndrome and any additional genetic testing that may be of benefit for this family.   [name] questions were answered to his satisfaction today. Our contact information was provided should additional questions or concerns arise.   Austin Ogren, MS, [facility] Licensed, Retail [email or web address] phone: [phone]  30 minutes were spent on the date of the encounter in service to the patient including preparation, face-to-face consultation, documentation and care coordination.  The patient was seen alone.    I connected with  [name] on 01/29/24 at 3 pm EDT by telephone and verified that I am speaking with the correct person using two identifiers.   Patient location: home Provider location: [facility]  __________________________________________________________________ For Office Staff:  Number of people involved in session: 1 Was an Intern/ student involved with case: no

## 2024-07-20 ENCOUNTER — Ambulatory Visit: Admitting: Dermatology
